# Patient Record
Sex: Male | Born: 2000 | Race: White | Hispanic: No | Marital: Single | Smoking: Never smoker
Health system: Southern US, Community
[De-identification: ages and names within clinical notes are randomized; demographics above are authoritative.]

## PROBLEM LIST (undated history)

## (undated) DIAGNOSIS — J45909 Unspecified asthma, uncomplicated: Secondary | ICD-10-CM

---
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
## 2022-06-26 DIAGNOSIS — Z419 Encounter for procedure for purposes other than remedying health state, unspecified: Secondary | ICD-10-CM | POA: Diagnosis not present

## 2022-07-27 DIAGNOSIS — Z419 Encounter for procedure for purposes other than remedying health state, unspecified: Secondary | ICD-10-CM | POA: Diagnosis not present

## 2022-08-26 DIAGNOSIS — Z419 Encounter for procedure for purposes other than remedying health state, unspecified: Secondary | ICD-10-CM | POA: Diagnosis not present

## 2022-09-26 DIAGNOSIS — Z419 Encounter for procedure for purposes other than remedying health state, unspecified: Secondary | ICD-10-CM | POA: Diagnosis not present

## 2022-10-26 DIAGNOSIS — Z419 Encounter for procedure for purposes other than remedying health state, unspecified: Secondary | ICD-10-CM | POA: Diagnosis not present

## 2022-11-26 DIAGNOSIS — Z419 Encounter for procedure for purposes other than remedying health state, unspecified: Secondary | ICD-10-CM | POA: Diagnosis not present

## 2022-12-27 DIAGNOSIS — Z419 Encounter for procedure for purposes other than remedying health state, unspecified: Secondary | ICD-10-CM | POA: Diagnosis not present

## 2023-01-26 DIAGNOSIS — Z419 Encounter for procedure for purposes other than remedying health state, unspecified: Secondary | ICD-10-CM | POA: Diagnosis not present

## 2023-02-26 DIAGNOSIS — Z419 Encounter for procedure for purposes other than remedying health state, unspecified: Secondary | ICD-10-CM | POA: Diagnosis not present

## 2023-03-28 DIAGNOSIS — Z419 Encounter for procedure for purposes other than remedying health state, unspecified: Secondary | ICD-10-CM | POA: Diagnosis not present

## 2023-04-16 ENCOUNTER — Encounter (HOSPITAL_COMMUNITY): Payer: Self-pay

## 2023-04-16 ENCOUNTER — Ambulatory Visit (HOSPITAL_COMMUNITY)
Admission: EM | Admit: 2023-04-16 | Discharge: 2023-04-16 | Disposition: A | Discharge status: Home / Self Care | Payer: Medicaid Other | Attending: Neurology | Admitting: Neurology

## 2023-04-16 DIAGNOSIS — J069 Acute upper respiratory infection, unspecified: Secondary | ICD-10-CM

## 2023-04-16 MED ORDER — FLUTICASONE PROPIONATE 50 MCG/ACT NA SUSP: 2.0000 | Freq: Every day | NASAL | 1 refills | Status: AC

## 2023-04-16 MED ORDER — BENZONATATE 100 MG PO CAPS: 100.0000 mg | ORAL_CAPSULE | Freq: Three times a day (TID) | ORAL | 0 refills | Status: AC

## 2023-04-16 MED ORDER — PROMETHAZINE-DM 6.25-15 MG/5ML PO SYRP: 5.0000 mL | ORAL_SOLUTION | Freq: Every evening | ORAL | 0 refills | Status: AC | PRN

## 2023-04-16 NOTE — ED Provider Notes (Signed)
[facility]    CSN: [id number] Arrival date & time: 04/16/23  1305      History   Chief Complaint Chief Complaint  Patient presents with   Cough    HPI William Gould is a 22 y.o. male.   Presenting with nasal drainage and a dry cough that started yesterday.  He states that he did have trouble sleeping overnight due to his cough.  He states he does not routinely get symptoms like this.  He has not tried any over-the-counter medications.  He denies fever, headache, trouble breathing, racing heart, or leg swelling.  He is not having any sinus pain or a productive cough.  Denies seasonal/environmental allergies.  He has not had any sick contacts.   The history is provided by the patient.  Cough Cough characteristics:  Dry Severity:  Moderate Onset quality:  Sudden Duration:  1 day Timing:  Sporadic Progression:  Unable to specify Chronicity:  New Smoker: no   Relieved by:  None tried Worsened by:  Lying down Ineffective treatments:  None tried Associated symptoms: rhinorrhea and sore throat   Associated symptoms: no headaches, no myalgias and no rash   Risk factors: no chemical exposure, no recent infection and no recent travel     Past Medical History:  Diagnosis Date   Asthma     Patient Active Problem List   Diagnosis Date Noted   Language barrier 12/18/2014   Refugee health examination 12/18/2014    History reviewed. No pertinent surgical history.     Home Medications    Prior to Admission medications   Medication Sig Start Date End Date Taking? Authorizing Provider  benzonatate (TESSALON) 100 MG capsule Take 1 capsule (100 mg total) by mouth every 8 (eight) hours. 04/16/23  Yes Isabellarose Kope, Ludger Nutting, NP  fluticasone (FLONASE) 50 MCG/ACT nasal spray Place 2 sprays into both nostrils daily. 04/16/23  Yes Kei Mcelhiney, Ludger Nutting, NP  promethazine-dextromethorphan (PROMETHAZINE-DM) 6.25-15 MG/5ML syrup Take 5 mLs by mouth at bedtime as needed for cough. 04/16/23   Yes Elmer Picker, NP    Family History History reviewed. No pertinent family history.  Social History Social History   Tobacco Use   Smoking status: Never   Smokeless tobacco: Never  Substance Use Topics   Alcohol use: No    Alcohol/week: 0.0 standard drinks of alcohol   Drug use: No     Allergies   Patient has no known allergies.   Review of Systems Review of Systems  Constitutional: Negative.   HENT:  Positive for rhinorrhea and sore throat.   Eyes: Negative.   Respiratory:  Positive for cough.   Cardiovascular: Negative.   Gastrointestinal: Negative.   Musculoskeletal:  Negative for myalgias.  Skin:  Negative for rash.  Neurological:  Negative for headaches.     Physical Exam Triage Vital Signs ED Triage Vitals  Encounter Vitals Group     BP 04/16/23 1453 135/84     Systolic BP Percentile --      Diastolic BP Percentile --      Pulse Rate 04/16/23 1453 88     Resp 04/16/23 1453 16     Temp 04/16/23 1453 98.4 F (36.9 C)     Temp Source 04/16/23 1453 Oral     SpO2 04/16/23 1453 97 %     Weight --      Height 04/16/23 1452 5\' 10"  (1.778 m)     Head Circumference --      Peak Flow --  Pain Score 04/16/23 1452 0     Pain Loc --      Pain Education --      Exclude from Growth Chart --    No data found.  Updated Vital Signs BP 135/84 (BP Location: Left Arm)   Pulse 88   Temp 98.4 F (36.9 C) (Oral)   Resp 16   Ht 5\' 10"  (1.778 m)   SpO2 97%   Visual Acuity Right Eye Distance:   Left Eye Distance:   Bilateral Distance:    Right Eye Near:   Left Eye Near:    Bilateral Near:     Physical Exam Constitutional:      Appearance: Normal appearance. He is normal weight.  HENT:     Head: Normocephalic.     Right Ear: Tympanic membrane, ear canal and external ear normal.     Left Ear: Tympanic membrane, ear canal and external ear normal.     Nose: Rhinorrhea present.     Mouth/Throat:     Mouth: Mucous membranes are moist.     Pharynx:  Oropharynx is clear. No oropharyngeal exudate or posterior oropharyngeal erythema.  Cardiovascular:     Rate and Rhythm: Normal rate.     Pulses: Normal pulses.  Pulmonary:     Effort: Pulmonary effort is normal.     Breath sounds: Normal breath sounds.  Musculoskeletal:     Cervical back: Normal range of motion.  Skin:    General: Skin is warm and dry.     Capillary Refill: Capillary refill takes less than 2 seconds.  Neurological:     General: No focal deficit present.     Mental Status: He is alert and oriented to person, place, and time. Mental status is at baseline.      UC Treatments / Results  Labs (all labs ordered are listed, but only abnormal results are displayed) Labs Reviewed - No data to display  EKG   Radiology No results found.  Procedures Procedures (including critical care time)  Medications Ordered in UC Medications - No data to display  Initial Impression / Assessment and Plan / UC Course  I have reviewed the triage vital signs and the nursing notes.  Pertinent labs & imaging results that were available during my care of the patient were reviewed by me and considered in my medical decision making (see chart for details).  Presentation is consistent with upper respiratory virus.   Symptoms have been present for 1 day and prescriptions for further symptomatic relief sent, may continue using OTC medications as needed. Deferred imaging of the chest based on stable cardiopulmonary exam and hemodynamically stable vital signs. Recommend follow-up with urgent care if symptoms do not improve in the next week.  Counseled on red flag symptoms that require an ER evaluation as well.       Final Clinical Impressions(s) / UC Diagnoses   Final diagnoses:  Viral URI with cough     Discharge Instructions      You have a viral illness which will improve on its own with rest, fluids, and medications to help with your symptoms. We discussed prescriptions  that may help with your symptoms: tessalon pearls, promethazine -DM You may use over the counter medicines as needed: tylenol/motrin, mucinex, zyrtec, Flonase Two teaspoons of honey in 1 cup of warm water every 4-6 hours may help with throat pains. Humidifier in room at nighttime may help soothe cough (clean well daily).   For chest pain, shortness of  breath, inability to keep food or fluids down without vomiting, fever that does not respond to tylenol or motrin, or any other severe symptoms, please go to the ER for further evaluation. Return to urgent care as needed, otherwise follow-up with PCP.     ED Prescriptions     Medication Sig Dispense Auth. Provider   promethazine-dextromethorphan (PROMETHAZINE-DM) 6.25-15 MG/5ML syrup Take 5 mLs by mouth at bedtime as needed for cough. 118 mL Elmer Picker, NP   benzonatate (TESSALON) 100 MG capsule Take 1 capsule (100 mg total) by mouth every 8 (eight) hours. 21 capsule Pamalee Leyden, [place], NP   fluticasone (FLONASE) 50 MCG/ACT nasal spray Place 2 sprays into both nostrils daily. 16 g Elmer Picker, NP      PDMP not reviewed this encounter.   Elmer Picker, NP 04/16/23 1525

## 2023-04-16 NOTE — ED Triage Notes (Signed)
Patient here today with c/o cough, runny nose, dry throat, and fatigue X 2 days. His mom was also sick.

## 2023-04-16 NOTE — Discharge Instructions (Signed)
You have a viral illness which will improve on its own with rest, fluids, and medications to help with your symptoms. We discussed prescriptions that may help with your symptoms: tessalon pearls, promethazine -DM You may use over the counter medicines as needed: tylenol/motrin, mucinex, zyrtec, Flonase Two teaspoons of honey in 1 cup of warm water every 4-6 hours may help with throat pains. Humidifier in room at nighttime may help soothe cough (clean well daily).   For chest pain, shortness of breath, inability to keep food or fluids down without vomiting, fever that does not respond to tylenol or motrin, or any other severe symptoms, please go to the ER for further evaluation. Return to urgent care as needed, otherwise follow-up with PCP.

## 2023-04-28 DIAGNOSIS — Z419 Encounter for procedure for purposes other than remedying health state, unspecified: Secondary | ICD-10-CM | POA: Diagnosis not present

## 2023-05-29 DIAGNOSIS — Z419 Encounter for procedure for purposes other than remedying health state, unspecified: Secondary | ICD-10-CM | POA: Diagnosis not present

## 2023-06-26 DIAGNOSIS — Z419 Encounter for procedure for purposes other than remedying health state, unspecified: Secondary | ICD-10-CM | POA: Diagnosis not present

## 2023-08-07 DIAGNOSIS — Z419 Encounter for procedure for purposes other than remedying health state, unspecified: Secondary | ICD-10-CM | POA: Diagnosis not present

## 2023-09-06 DIAGNOSIS — Z419 Encounter for procedure for purposes other than remedying health state, unspecified: Secondary | ICD-10-CM | POA: Diagnosis not present

## 2023-10-07 DIAGNOSIS — Z419 Encounter for procedure for purposes other than remedying health state, unspecified: Secondary | ICD-10-CM | POA: Diagnosis not present

## 2023-11-06 DIAGNOSIS — Z419 Encounter for procedure for purposes other than remedying health state, unspecified: Secondary | ICD-10-CM | POA: Diagnosis not present

## 2023-12-07 DIAGNOSIS — Z419 Encounter for procedure for purposes other than remedying health state, unspecified: Secondary | ICD-10-CM | POA: Diagnosis not present

## 2024-01-07 DIAGNOSIS — Z419 Encounter for procedure for purposes other than remedying health state, unspecified: Secondary | ICD-10-CM | POA: Diagnosis not present

## 2024-02-06 DIAGNOSIS — Z419 Encounter for procedure for purposes other than remedying health state, unspecified: Secondary | ICD-10-CM | POA: Diagnosis not present
